# Patient Record
Sex: Male | Born: 1995 | Race: White | Hispanic: No | Marital: Single | Smoking: Never smoker
Health system: Southern US, Community
[De-identification: ages and names within clinical notes are randomized; demographics above are authoritative.]

## PROBLEM LIST (undated history)

## (undated) DIAGNOSIS — F909 Attention-deficit hyperactivity disorder, unspecified type: Secondary | ICD-10-CM

## (undated) DIAGNOSIS — S060XAA Concussion with loss of consciousness status unknown, initial encounter: Secondary | ICD-10-CM

## (undated) DIAGNOSIS — S060X9A Concussion with loss of consciousness of unspecified duration, initial encounter: Secondary | ICD-10-CM

---
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
## 2018-05-09 ENCOUNTER — Emergency Department: Payer: 59

## 2018-05-09 ENCOUNTER — Emergency Department
Admission: EM | Admit: 2018-05-09 | Discharge: 2018-05-09 | Disposition: A | Discharge status: Home / Self Care | Payer: 59 | Attending: Student in an Organized Health Care Education/Training Program | Admitting: Student in an Organized Health Care Education/Training Program

## 2018-05-09 ENCOUNTER — Other Ambulatory Visit: Payer: Self-pay

## 2018-05-09 ENCOUNTER — Encounter: Payer: Self-pay | Admitting: Emergency Medicine

## 2018-05-09 DIAGNOSIS — F17228 Nicotine dependence, chewing tobacco, with other nicotine-induced disorders: Secondary | ICD-10-CM | POA: Insufficient documentation

## 2018-05-09 DIAGNOSIS — Z79899 Other long term (current) drug therapy: Secondary | ICD-10-CM | POA: Diagnosis not present

## 2018-05-09 DIAGNOSIS — R569 Unspecified convulsions: Secondary | ICD-10-CM | POA: Insufficient documentation

## 2018-05-09 HISTORY — DX: Concussion with loss of consciousness status unknown, initial encounter: S06.0XAA

## 2018-05-09 HISTORY — DX: Concussion with loss of consciousness of unspecified duration, initial encounter: S06.0X9A

## 2018-05-09 HISTORY — DX: Attention-deficit hyperactivity disorder, unspecified type: F90.9

## 2018-05-09 LAB — COMPREHENSIVE METABOLIC PANEL
ALT: 16 U/L (ref 0–44)
AST: 29 U/L (ref 15–41)
Albumin: 4.6 g/dL (ref 3.5–5.0)
Alkaline Phosphatase: 92 U/L (ref 38–126)
Anion gap: 17 — ABNORMAL HIGH (ref 5–15)
BUN: 11 mg/dL (ref 6–20)
CO2: 17 mmol/L — ABNORMAL LOW (ref 22–32)
Calcium: 9.3 mg/dL (ref 8.9–10.3)
Chloride: 102 mmol/L (ref 98–111)
Creatinine, Ser: 1.18 mg/dL (ref 0.61–1.24)
GFR calc non Af Amer: >60 mL/min (ref >60)
Glucose, Bld: 224 mg/dL — ABNORMAL HIGH (ref 70–99)
Potassium: 3.5 mmol/L (ref 3.5–5.1)
Sodium: 136 mmol/L (ref 135–145)
Total Bilirubin: 0.6 mg/dL (ref 0.3–1.2)
Total Protein: 8.1 g/dL (ref 6.5–8.1)

## 2018-05-09 LAB — CBC WITH DIFFERENTIAL/PLATELET
ABS IMMATURE GRANULOCYTES: 0.11 10*3/uL — AB (ref 0.00–0.07)
Basophils Absolute: 0.1 10*3/uL (ref 0.0–0.1)
Basophils Relative: 1 %
Eosinophils Absolute: 0 10*3/uL (ref 0.0–0.5)
Eosinophils Relative: 0 %
HCT: 45.4 % (ref 39.0–52.0)
Hemoglobin: 15.8 g/dL (ref 13.0–17.0)
Immature Granulocytes: 1 %
Lymphocytes Relative: 22 %
Lymphs Abs: 2.1 10*3/uL (ref 0.7–4.0)
MCH: 31.1 pg (ref 26.0–34.0)
MCHC: 34.8 g/dL (ref 30.0–36.0)
MCV: 89.4 fL (ref 80.0–100.0)
MONO ABS: 1.4 10*3/uL — AB (ref 0.1–1.0)
Monocytes Relative: 14 %
NEUTROS ABS: 6.1 10*3/uL (ref 1.7–7.7)
Neutrophils Relative %: 62 %
Platelets: 317 10*3/uL (ref 150–400)
RBC: 5.08 MIL/uL (ref 4.22–5.81)
RDW: 11.9 % (ref 11.5–15.5)
WBC: 9.8 10*3/uL (ref 4.0–10.5)
nRBC: 0 % (ref 0.0–0.2)

## 2018-05-09 LAB — URINE DRUG SCREEN, QUALITATIVE (ARMC ONLY)
AMPHETAMINES, UR SCREEN: NOT DETECTED
Barbiturates, Ur Screen: NOT DETECTED
Benzodiazepine, Ur Scrn: NOT DETECTED
Cannabinoid 50 Ng, Ur ~~LOC~~: POSITIVE — AB
Cocaine Metabolite,Ur ~~LOC~~: NOT DETECTED
MDMA (Ecstasy)Ur Screen: NOT DETECTED
Methadone Scn, Ur: NOT DETECTED
Opiate, Ur Screen: NOT DETECTED
Phencyclidine (PCP) Ur S: NOT DETECTED
Tricyclic, Ur Screen: NOT DETECTED

## 2018-05-09 MED ORDER — LEVETIRACETAM 500 MG PO TABS
500.0000 mg | ORAL_TABLET | Freq: Once | ORAL | Status: AC
  Administered 2018-05-09: 500 mg via ORAL
  Filled 2018-05-09: qty 1

## 2018-05-09 MED ORDER — LEVETIRACETAM 500 MG PO TABS: 500.0000 mg | ORAL_TABLET | Freq: Two times a day (BID) | ORAL | 0 refills | Status: AC

## 2018-05-09 NOTE — Discharge Instructions (Signed)
Do not drive a vehicle or operate heavy machinery until cleared by neurology.  Do not swim or bathe alone.

## 2018-05-09 NOTE — ED Notes (Signed)
With pts permission and on his cell phone I talked to pts dad about giving him keppra, negative test results, neurology follow up. Pt was beside me for the entire conversation.

## 2018-05-09 NOTE — ED Triage Notes (Addendum)
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 when ems arrived pt was confused and vomited x 2. Pt noted to have bruising right side of tongue. pt states that he has no hx of seizures. ems gave zofran 4 mg in route.

## 2018-05-09 NOTE — ED Provider Notes (Signed)
[facility] Emergency Department Provider Note    First MD Initiated Contact with Patient 05/09/18 1048     (approximate)  I have reviewed the triage vital signs and the nursing notes.   HISTORY  Chief Complaint Seizures    HPI Jeffery Hall is a 23 y.o. male presents the ER for evaluation of seizure-like episode.  Had a similar episode the 13th but this morning.  Patient was reportedly at the police station today pain a citation was witnessed to have generalized tonic-clonic seizure-like activity lasting a few minutes followed by postdoctoral..  Did bite his tongue.  Did not lose urinary continence.  Denies any history of seizures.  Denies any other illicit drug use.  Does not report any trauma.  No family history of epilepsy.    Past Medical History:  Diagnosis Date  . ADHD   . Concussion    No family history on file.  There are no active problems to display for this patient.     Prior to Admission medications   Medication Sig Start Date End Date Taking? Authorizing Provider  VYVANSE 30 MG capsule Take 30 mg by mouth daily. 04/18/18  Yes [provider]  levETIRAcetam (KEPPRA) 500 MG tablet Take 1 tablet (500 mg total) by mouth 2 (two) times daily for 30 days. 05/09/18 06/08/18  Willy Eddy, MD    Allergies Patient has no known allergies.    Social History Social History   Tobacco Use  . Smoking status: Never Smoker  . Smokeless tobacco: Current User  Substance Use Topics  . Alcohol use: Yes  . Drug use: Never    Review of Systems Patient denies headaches, rhinorrhea, blurry vision, numbness, shortness of breath, chest pain, edema, cough, abdominal pain, nausea, vomiting, diarrhea, dysuria, fevers, rashes or hallucinations unless otherwise stated above in HPI. ____________________________________________   PHYSICAL EXAM:  VITAL SIGNS: Vitals:   05/09/18 1017 05/09/18 1130  BP: 123/75 122/77  Pulse: 77 88  Temp:  97.7 F (36.5 C)   SpO2: 96% 99%    Constitutional: Alert and oriented.  Eyes: Conjunctivae are normal.  Head: Atraumatic. Nose: No congestion/rhinnorhea. Mouth/Throat: Mucous membranes are moist.  Contusion to right tongue Neck: No stridor. Painless ROM.  Cardiovascular: Normal rate, regular rhythm. Grossly normal heart sounds.  Good peripheral circulation. Respiratory: Normal respiratory effort.  No retractions. Lungs CTAB. Gastrointestinal: Soft and nontender. No distention. No abdominal bruits. No CVA tenderness. Genitourinary:  Musculoskeletal: No lower extremity tenderness nor edema.  No joint effusions. Neurologic:  CN- intact.  No facial droop, Normal FNF.  Normal heel to shin.  Sensation intact bilaterally. Normal speech and language. No gross focal neurologic deficits are appreciated. No gait instability. Skin:  Skin is warm, dry and intact. No rash noted. Psychiatric: Mood and affect are normal. Speech and behavior are normal.  ____________________________________________   LABS (all labs ordered are listed, but only abnormal results are displayed)  Results for orders placed or performed during the hospital encounter of 05/09/18 (from the past 24 hour(s))  CBC with Differential/Platelet     Status: Abnormal   Collection Time: 05/09/18 10:51 AM  Result Value Ref Range   WBC 9.8 4.0 - 10.5 K/uL   RBC 5.08 4.22 - 5.81 MIL/uL   Hemoglobin 15.8 13.0 - 17.0 g/dL   HCT 16.1 09.6 - 04.5 %   MCV 89.4 80.0 - 100.0 fL   MCH 31.1 26.0 - 34.0 pg   MCHC 34.8 30.0 - 36.0 g/dL  RDW 11.9 11.5 - 15.5 %   Platelets 317 150 - 400 K/uL   nRBC 0.0 0.0 - 0.2 %   Neutrophils Relative % 62 %   Neutro Abs 6.1 1.7 - 7.7 K/uL   Lymphocytes Relative 22 %   Lymphs Abs 2.1 0.7 - 4.0 K/uL   Monocytes Relative 14 %   Monocytes Absolute 1.4 (H) 0.1 - 1.0 K/uL   Eosinophils Relative 0 %   Eosinophils Absolute 0.0 0.0 - 0.5 K/uL   Basophils Relative 1 %   Basophils Absolute 0.1 0.0 - 0.1 K/uL    Immature Granulocytes 1 %   Abs Immature Granulocytes 0.11 (H) 0.00 - 0.07 K/uL  Comprehensive metabolic panel     Status: Abnormal   Collection Time: 05/09/18 10:51 AM  Result Value Ref Range   Sodium 136 135 - 145 mmol/L   Potassium 3.5 3.5 - 5.1 mmol/L   Chloride 102 98 - 111 mmol/L   CO2 17 (L) 22 - 32 mmol/L   Glucose, Bld 224 (H) 70 - 99 mg/dL   BUN 11 6 - 20 mg/dL   Creatinine, Ser 7.82 0.61 - 1.24 mg/dL   Calcium 9.3 8.9 - 42.3 mg/dL   Total Protein 8.1 6.5 - 8.1 g/dL   Albumin 4.6 3.5 - 5.0 g/dL   AST 29 15 - 41 U/L   ALT 16 0 - 44 U/L   Alkaline Phosphatase 92 38 - 126 U/L   Total Bilirubin 0.6 0.3 - 1.2 mg/dL   GFR calc non Af Amer >60 >60 mL/min   GFR calc Af Amer >60 >60 mL/min   Anion gap 17 (H) 5 - 15  Urine Drug Screen, Qualitative ([facility] only)     Status: Abnormal   Collection Time: 05/09/18 10:51 AM  Result Value Ref Range   Tricyclic, Ur Screen NONE DETECTED NONE DETECTED   Amphetamines, Ur Screen NONE DETECTED NONE DETECTED   MDMA (Ecstasy)Ur Screen NONE DETECTED NONE DETECTED   Cocaine Metabolite,Ur [place] NONE DETECTED NONE DETECTED   Opiate, Ur Screen NONE DETECTED NONE DETECTED   Phencyclidine (PCP) Ur S NONE DETECTED NONE DETECTED   Cannabinoid 50 Ng, Ur [place] POSITIVE (A) NONE DETECTED   Barbiturates, Ur Screen NONE DETECTED NONE DETECTED   Benzodiazepine, Ur Scrn NONE DETECTED NONE DETECTED   Methadone Scn, Ur NONE DETECTED NONE DETECTED   ____________________________________________  EKG My review and personal interpretation at Time: 10:19   Indication: seizure like activity  Rate: 75  Rhythm: sinus Axis: normal Other: normal intervals, ____________________________________________  RADIOLOGY  I personally reviewed all radiographic images ordered to evaluate for the above acute complaints and reviewed radiology reports and findings.  These findings were personally discussed with the patient.  Please see medical record for radiology  report.  ____________________________________________   PROCEDURES  Procedure(s) performed:  Procedures    Critical Care performed: no ____________________________________________   INITIAL IMPRESSION / ASSESSMENT AND PLAN / ED COURSE  Pertinent labs & imaging results that were available during my care of the patient were reviewed by me and considered in my medical decision making (see chart for details).   DDX: Epilepsy, seizure, dysrhythmia, electrolyte abnormality, substance abuse, mass, hyperglycemia  Jeffery Hall is a 23 y.o. who presents to the ED with symptoms as described above.  Patient currently well-appearing in no acute distress.  Symptoms certainly sound like he just had his second witnessed seizure.  Blood work will be sent for the above differential.  CT imaging will be ordered  to evaluate for any anatomic mass.  He is otherwise well-appearing at this time.  Will keep on monitor.  Will consult neurology.  Clinical Course as of May 10 1319  Tue May 09, 2018  1216 Discussed case in consultation with neurology Dr. Petra KubaKilpatrick who does recommend initiating Keppra 500 twice daily.   [PR]    Clinical Course User Index [PR] Willy Eddyobinson, Hurley Sobel, MD     As part of my medical decision making, I reviewed the following data within the electronic MEDICAL RECORD NUMBER Nursing notes reviewed and incorporated, Labs reviewed, notes from prior ED visits.   ____________________________________________   FINAL CLINICAL IMPRESSION(S) / ED DIAGNOSES  Final diagnoses:  Seizure-like activity (HCC)      NEW MEDICATIONS STARTED DURING THIS VISIT:  New Prescriptions   LEVETIRACETAM (KEPPRA) 500 MG TABLET    Take 1 tablet (500 mg total) by mouth 2 (two) times daily for 30 days.     Note:  This document was prepared using Dragon voice recognition software and may include unintentional dictation errors.    Willy Eddyobinson, Carmella Kees, MD 05/09/18 1322

## 2018-05-09 NOTE — ED Notes (Signed)
Patient transported to CT 

## 2018-05-17 ENCOUNTER — Encounter: Payer: Self-pay | Admitting: Neurology

## 2018-05-17 ENCOUNTER — Ambulatory Visit
Admission: RE | Admit: 2018-05-17 | Discharge: 2018-05-17 | Disposition: A | Discharge status: Home / Self Care | Payer: No Typology Code available for payment source | Source: Ambulatory Visit | Attending: Neurology | Admitting: Neurology

## 2018-05-17 DIAGNOSIS — F0781 Postconcussional syndrome: Secondary | ICD-10-CM

## 2018-05-17 DIAGNOSIS — R569 Unspecified convulsions: Secondary | ICD-10-CM | POA: Insufficient documentation

## 2018-05-19 NOTE — Procedures (Signed)
Date: 05/17/2018    PROCEDURE TYPE: Electroencephalogram performed during  wakefulness, drowsiness and sleep.    REFERRING DOCTOR: Colon Flattery. Nunzio Cory, MD.    MEDICATIONS: None.    CLINICAL: This routine EEG is performed, for this 23 year old  man with a new-onset seizure and remote history of concussion,  to assess for risk for recurrent seizures and epilepsy.    FINDINGS: This study is performed during wakefulness, drowsiness  and sleep.  The predominant activity of wakefulness with eye  closure was a posterior dominant, symmetric, moderate amplitude,  11 CPS activity that was reactive.  There was no focal slowing  nor any epileptiform discharges.  Drowsiness was attained as  manifested by centrally placed vertex sharp waves.  Clinical  sleep was also attained.    There were occasional posterior occipital sharp transients  during sleep, a benign finding.    Hyperventilation was performed for 3 minutes with fair effort,  and elicited a moderate buildup of symmetric, moderate  amplitude, theta activities.    Photic stimulation elicited a symmetric, posterior dominant,  driving response.    The EKG tracing identified a sinus rhythm.    CLINICAL INTERPRETATION: This routine electroencephalogram  performed during wakefulness, drowsiness and sleep was normal.  A normal EEG does not preclude the diagnosis of epilepsy.  Clinical correlation is advised.        [id number]  DD: 05/19/2018 05:51:06  DT: 05/19/2018 06:33:46  JOB: [id number]

## 2018-05-22 ENCOUNTER — Encounter: Payer: Self-pay | Admitting: Neurology

## 2018-05-22 DIAGNOSIS — R569 Unspecified convulsions: Secondary | ICD-10-CM

## 2018-05-22 DIAGNOSIS — F419 Anxiety disorder, unspecified: Secondary | ICD-10-CM

## 2018-05-22 DIAGNOSIS — G4700 Insomnia, unspecified: Secondary | ICD-10-CM

## 2018-05-22 DIAGNOSIS — F988 Other specified behavioral and emotional disorders with onset usually occurring in childhood and adolescence: Secondary | ICD-10-CM

## 2018-06-27 ENCOUNTER — Encounter: Payer: Self-pay | Admitting: Neurology

## 2018-06-27 DIAGNOSIS — F988 Other specified behavioral and emotional disorders with onset usually occurring in childhood and adolescence: Secondary | ICD-10-CM

## 2018-06-27 DIAGNOSIS — R569 Unspecified convulsions: Secondary | ICD-10-CM

## 2018-06-27 DIAGNOSIS — F419 Anxiety disorder, unspecified: Secondary | ICD-10-CM

## 2018-06-27 DIAGNOSIS — G4700 Insomnia, unspecified: Secondary | ICD-10-CM

## 2018-07-26 ENCOUNTER — Ambulatory Visit: Payer: No Typology Code available for payment source

## 2018-08-31 ENCOUNTER — Other Ambulatory Visit
Admission: RE | Admit: 2018-08-31 | Discharge: 2018-08-31 | Disposition: A | Discharge status: Home / Self Care | Payer: No Typology Code available for payment source | Source: Ambulatory Visit | Attending: Internal Medicine | Admitting: Internal Medicine

## 2018-08-31 ENCOUNTER — Ambulatory Visit (INDEPENDENT_AMBULATORY_CARE_PROVIDER_SITE_OTHER): Payer: No Typology Code available for payment source

## 2018-08-31 DIAGNOSIS — Z0189 Encounter for other specified special examinations: Secondary | ICD-10-CM

## 2018-09-02 LAB — VH SARS COV 2 RNA: SARS-CoV-2 RNA (COVID-19) Qualitative NAAT: NOT DETECTED

## 2019-06-18 IMAGING — CT CT HEAD W/O CM
3 series · 16 of 46 positions shown, 19 images · non-contrast
Comparison: None.

CLINICAL DATA: Seizure, new, nontraumatic.

EXAM:
CT HEAD WITHOUT CONTRAST
TECHNIQUE: Contiguous axial images were obtained from the base of the skull
through the vertex without intravenous contrast.

[Series 3: head wo · axial · 0.43mm/px · z∈[-70,+50]mm · 10 of 29 slices shown, 13 images]
[im 3/29  brain]
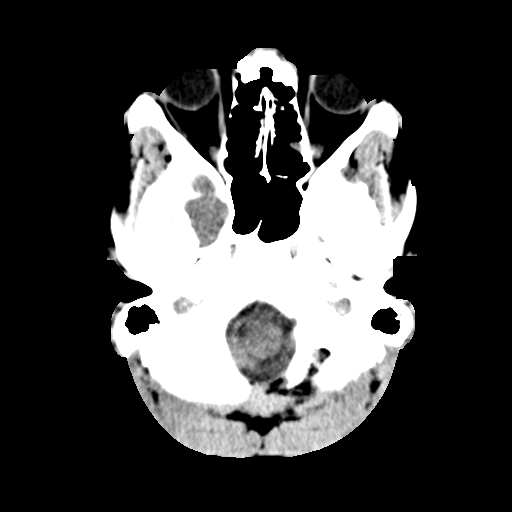
[im 3/29  bone]
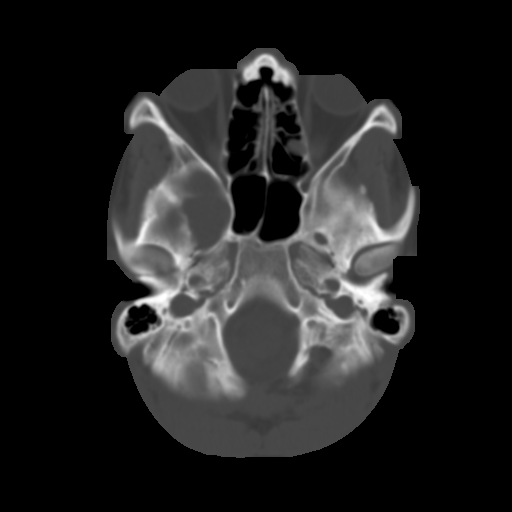
[im 6/29  brain]
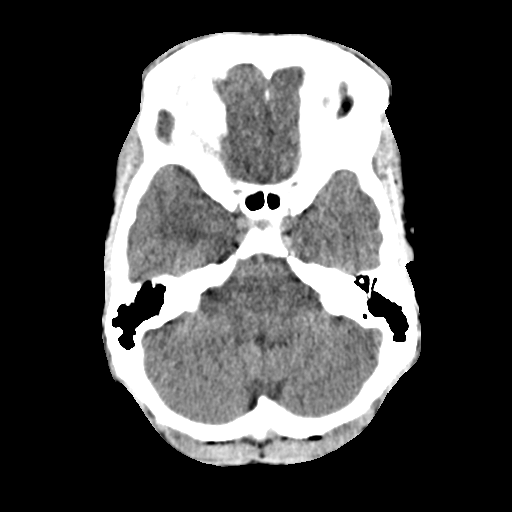
[im 8/29  brain]
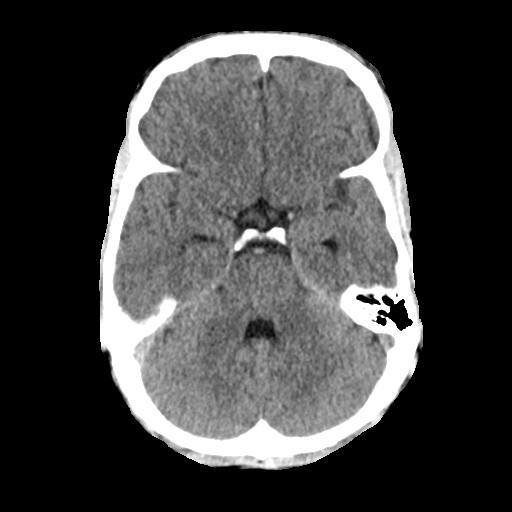
[im 11/29  brain]
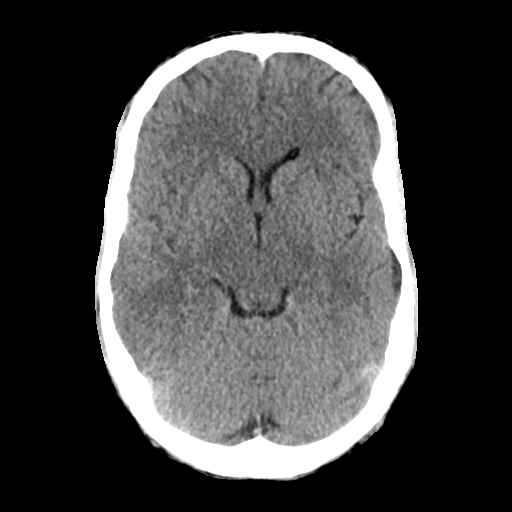
[im 14/29  brain]
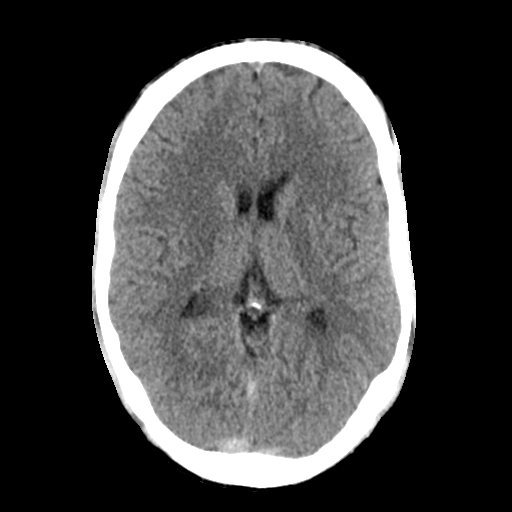
[im 14/29  bone]
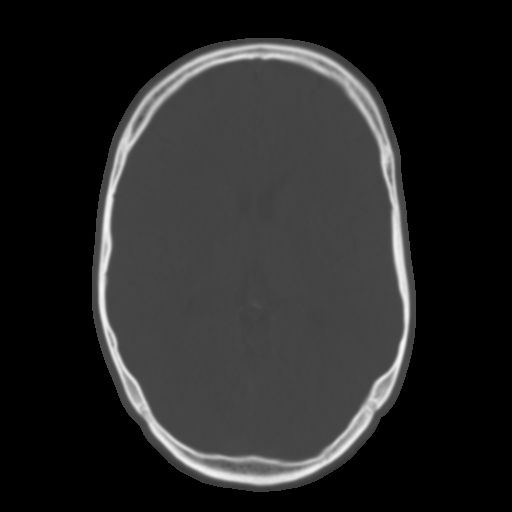
[im 16/29  brain]
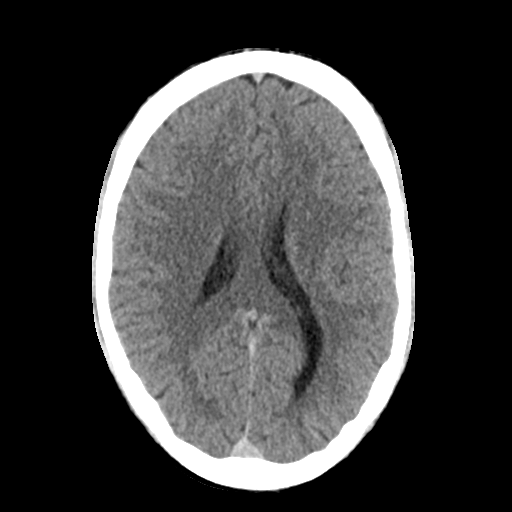
[im 19/29  brain]
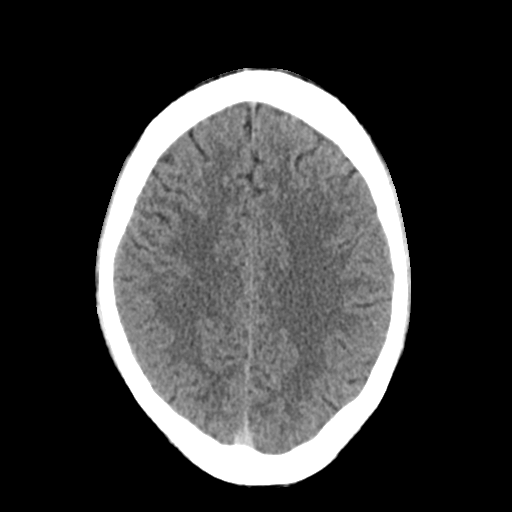
[im 22/29  brain]
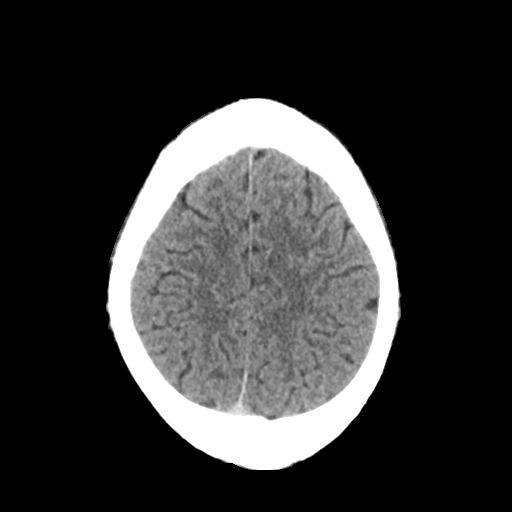
[im 24/29  brain]
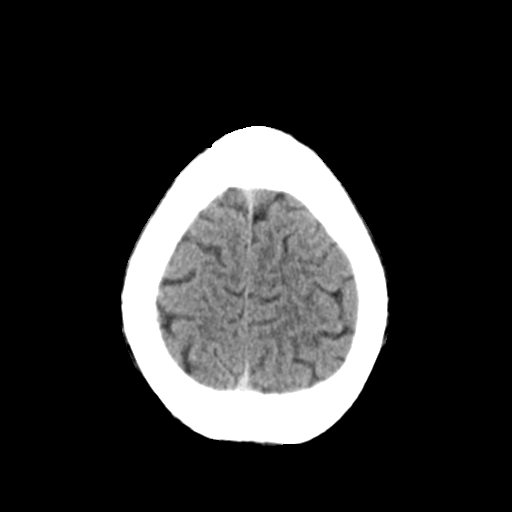
[im 24/29  bone]
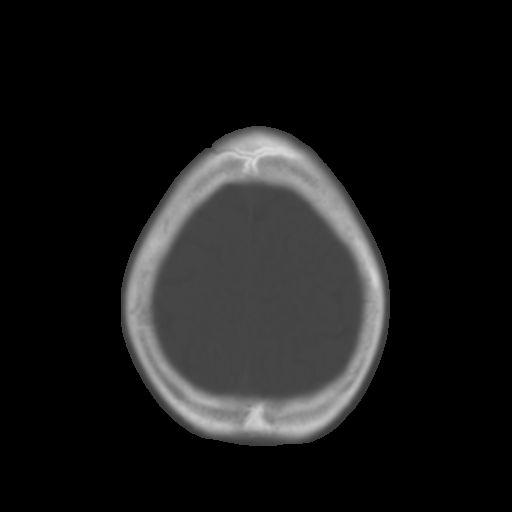
[im 27/29  brain]
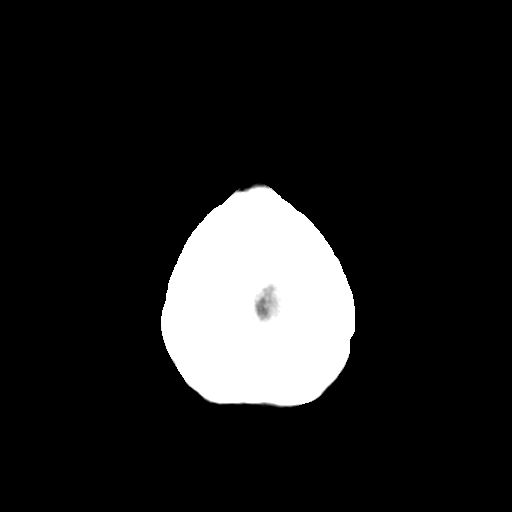

[Series 4: coronal soft tissue · coronal · 0.35mm/px · 3 of 70 slices shown]
[im 24/70  brain]
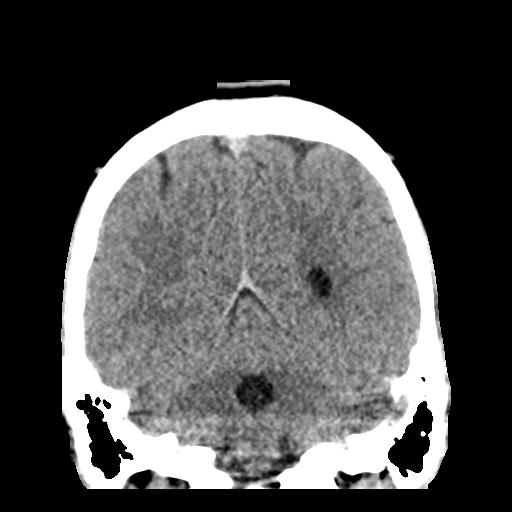
[im 31/70  brain]
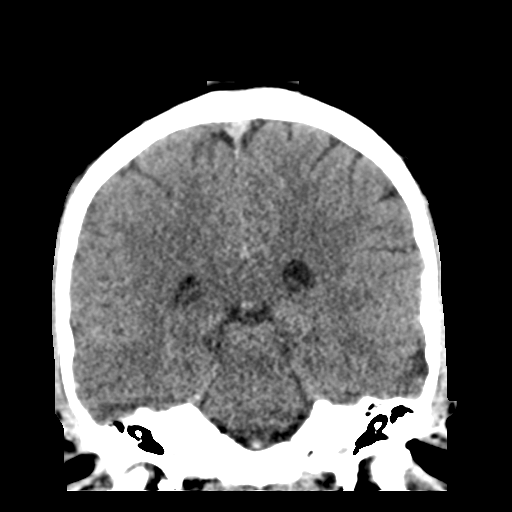
[im 39/70  brain]
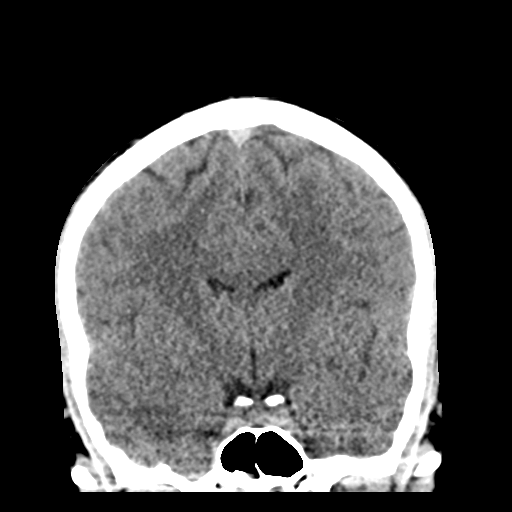

[Series 5: sagittal soft tissue · sagittal · 0.33mm/px · 3 of 65 slices shown]
[im 22/65  brain]
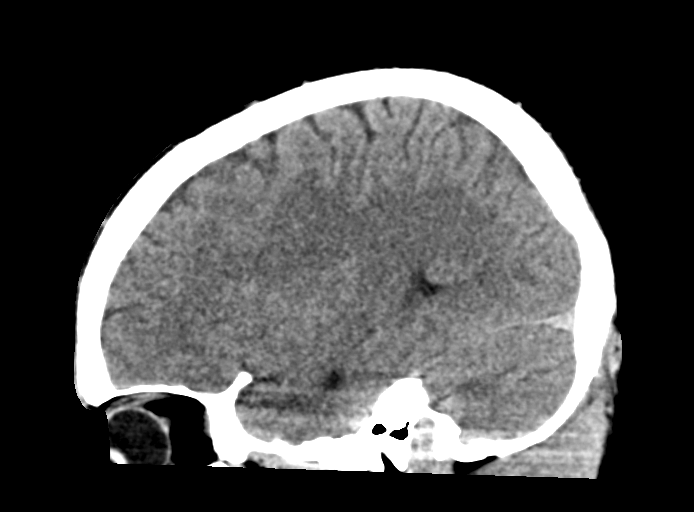
[im 33/65  brain]
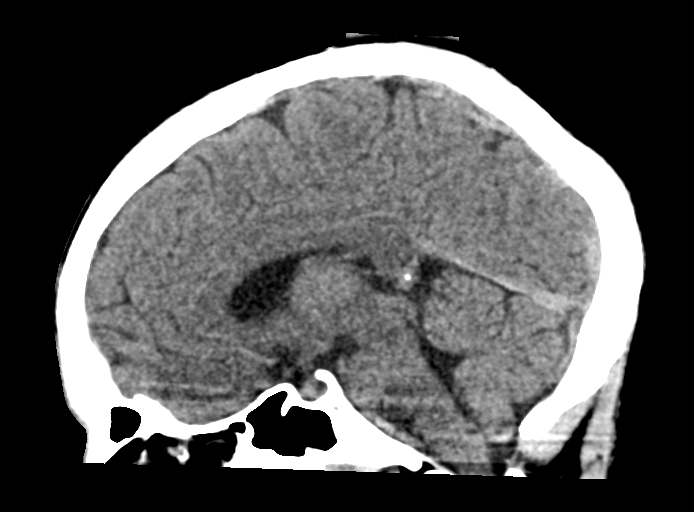
[im 43/65  brain]
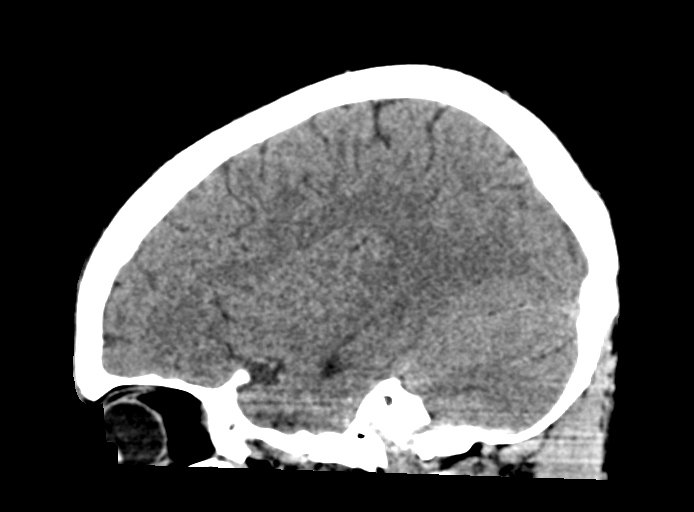

[16 of 46 positions shown; findings below may reference images not displayed]

FINDINGS: Brain: No evidence of infarction, hemorrhage, hydrocephalus,
extra-axial collection or mass lesion/mass effect. No cortical
finding to explain seizure.

Vascular: Negative

Skull: No evidence of injury or lesion

Sinuses/Orbits: Negative
IMPRESSION: Negative head CT.
